# Patient Record
Sex: Female | Born: 1935 | Race: White | Hispanic: No | Smoking: Former smoker
Health system: Southern US, Community
[De-identification: ages and names within clinical notes are randomized; demographics above are authoritative.]

## PROBLEM LIST (undated history)

## (undated) DIAGNOSIS — K449 Diaphragmatic hernia without obstruction or gangrene: Secondary | ICD-10-CM

## (undated) DIAGNOSIS — I219 Acute myocardial infarction, unspecified: Secondary | ICD-10-CM

## (undated) DIAGNOSIS — K5790 Diverticulosis of intestine, part unspecified, without perforation or abscess without bleeding: Secondary | ICD-10-CM

## (undated) DIAGNOSIS — I10 Essential (primary) hypertension: Secondary | ICD-10-CM

## (undated) HISTORY — PX: CORONARY STENT PLACEMENT: SHX1402

## (undated) HISTORY — PX: APPENDECTOMY: SHX54

---

## 2007-04-14 ENCOUNTER — Emergency Department (HOSPITAL_COMMUNITY): Admission: EM | Admit: 2007-04-14 | Discharge: 2007-04-14 | Payer: Self-pay | Admitting: Emergency Medicine

## 2010-12-05 LAB — DIFFERENTIAL
Basophils Absolute: 0
Basophils Relative: 1
Eosinophils Absolute: 0.2
Monocytes Absolute: 1
Monocytes Relative: 13 — ABNORMAL HIGH
Neutrophils Relative %: 65

## 2010-12-05 LAB — CBC
Hemoglobin: 14.1
MCHC: 34.7
MCV: 91.2
RBC: 4.46
RDW: 12.8

## 2010-12-05 LAB — I-STAT 8, (EC8 V) (CONVERTED LAB)
BUN: 13
Bicarbonate: 26.2 — ABNORMAL HIGH
Chloride: 101
Glucose, Bld: 105 — ABNORMAL HIGH
pCO2, Ven: 44.5 — ABNORMAL LOW
pH, Ven: 7.377 — ABNORMAL HIGH

## 2010-12-05 LAB — POCT CARDIAC MARKERS
CKMB, poc: 1.1
Myoglobin, poc: 61.4
Operator id: 198171
Operator id: 198171
Troponin i, poc: <0.05

## 2012-04-14 ENCOUNTER — Institutional Professional Consult (permissible substitution): Payer: Self-pay | Admitting: Pulmonary Disease

## 2012-04-28 ENCOUNTER — Institutional Professional Consult (permissible substitution): Payer: Self-pay | Admitting: Pulmonary Disease

## 2012-05-19 ENCOUNTER — Institutional Professional Consult (permissible substitution): Payer: Self-pay | Admitting: Pulmonary Disease

## 2013-06-09 ENCOUNTER — Emergency Department (HOSPITAL_COMMUNITY)
Admission: EM | Admit: 2013-06-09 | Discharge: 2013-06-09 | Disposition: A | Discharge status: Home / Self Care | Payer: Medicare Other | Attending: Emergency Medicine | Admitting: Emergency Medicine

## 2013-06-09 ENCOUNTER — Emergency Department (HOSPITAL_COMMUNITY): Payer: Medicare Other

## 2013-06-09 ENCOUNTER — Encounter (HOSPITAL_COMMUNITY): Payer: Self-pay | Admitting: Emergency Medicine

## 2013-06-09 DIAGNOSIS — Z9861 Coronary angioplasty status: Secondary | ICD-10-CM | POA: Insufficient documentation

## 2013-06-09 DIAGNOSIS — I252 Old myocardial infarction: Secondary | ICD-10-CM | POA: Insufficient documentation

## 2013-06-09 DIAGNOSIS — Z87891 Personal history of nicotine dependence: Secondary | ICD-10-CM | POA: Insufficient documentation

## 2013-06-09 DIAGNOSIS — Z880 Allergy status to penicillin: Secondary | ICD-10-CM | POA: Insufficient documentation

## 2013-06-09 DIAGNOSIS — Z9089 Acquired absence of other organs: Secondary | ICD-10-CM | POA: Insufficient documentation

## 2013-06-09 DIAGNOSIS — R109 Unspecified abdominal pain: Secondary | ICD-10-CM

## 2013-06-09 DIAGNOSIS — Z79899 Other long term (current) drug therapy: Secondary | ICD-10-CM | POA: Insufficient documentation

## 2013-06-09 DIAGNOSIS — G8929 Other chronic pain: Secondary | ICD-10-CM | POA: Insufficient documentation

## 2013-06-09 DIAGNOSIS — Z8719 Personal history of other diseases of the digestive system: Secondary | ICD-10-CM | POA: Insufficient documentation

## 2013-06-09 DIAGNOSIS — R112 Nausea with vomiting, unspecified: Secondary | ICD-10-CM | POA: Insufficient documentation

## 2013-06-09 DIAGNOSIS — R1084 Generalized abdominal pain: Secondary | ICD-10-CM | POA: Insufficient documentation

## 2013-06-09 DIAGNOSIS — Z7902 Long term (current) use of antithrombotics/antiplatelets: Secondary | ICD-10-CM | POA: Insufficient documentation

## 2013-06-09 DIAGNOSIS — I10 Essential (primary) hypertension: Secondary | ICD-10-CM | POA: Insufficient documentation

## 2013-06-09 HISTORY — DX: Essential (primary) hypertension: I10

## 2013-06-09 HISTORY — DX: Diverticulosis of intestine, part unspecified, without perforation or abscess without bleeding: K57.90

## 2013-06-09 HISTORY — DX: Acute myocardial infarction, unspecified: I21.9

## 2013-06-09 HISTORY — DX: Diaphragmatic hernia without obstruction or gangrene: K44.9

## 2013-06-09 LAB — CBC WITH DIFFERENTIAL/PLATELET
BASOS PCT: 0 % (ref 0–1)
Basophils Absolute: 0 10*3/uL (ref 0.0–0.1)
Eosinophils Absolute: 0.3 10*3/uL (ref 0.0–0.7)
Eosinophils Relative: 4 % (ref 0–5)
HCT: 38.2 % (ref 36.0–46.0)
HEMOGLOBIN: 13.6 g/dL (ref 12.0–15.0)
LYMPHS ABS: 1.1 10*3/uL (ref 0.7–4.0)
Lymphocytes Relative: 14 % (ref 12–46)
MCH: 31 pg (ref 26.0–34.0)
MCHC: 35.6 g/dL (ref 30.0–36.0)
MCV: 87 fL (ref 78.0–100.0)
MONOS PCT: 15 % — AB (ref 3–12)
Monocytes Absolute: 1.2 10*3/uL — ABNORMAL HIGH (ref 0.1–1.0)
NEUTROS ABS: 5.4 10*3/uL (ref 1.7–7.7)
NEUTROS PCT: 66 % (ref 43–77)
PLATELETS: 250 10*3/uL (ref 150–400)
RBC: 4.39 MIL/uL (ref 3.87–5.11)
RDW: 12.4 % (ref 11.5–15.5)
WBC: 8.1 10*3/uL (ref 4.0–10.5)

## 2013-06-09 LAB — COMPREHENSIVE METABOLIC PANEL
ALBUMIN: 3.2 g/dL — AB (ref 3.5–5.2)
ALK PHOS: 77 U/L (ref 39–117)
ALT: 11 U/L (ref 0–35)
AST: 15 U/L (ref 0–37)
BILIRUBIN TOTAL: 0.5 mg/dL (ref 0.3–1.2)
BUN: 10 mg/dL (ref 6–23)
CHLORIDE: 94 meq/L — AB (ref 96–112)
CO2: 24 mEq/L (ref 19–32)
Calcium: 9.2 mg/dL (ref 8.4–10.5)
Creatinine, Ser: 0.75 mg/dL (ref 0.50–1.10)
GFR calc Af Amer: 89 mL/min — ABNORMAL LOW (ref >90)
GFR calc non Af Amer: 77 mL/min — ABNORMAL LOW (ref >90)
GLUCOSE: 142 mg/dL — AB (ref 70–99)
POTASSIUM: 4.3 meq/L (ref 3.7–5.3)
SODIUM: 132 meq/L — AB (ref 137–147)
TOTAL PROTEIN: 7.5 g/dL (ref 6.0–8.3)

## 2013-06-09 LAB — URINALYSIS, ROUTINE W REFLEX MICROSCOPIC
BILIRUBIN URINE: NEGATIVE
Glucose, UA: NEGATIVE mg/dL
HGB URINE DIPSTICK: NEGATIVE
KETONES UR: NEGATIVE mg/dL
Leukocytes, UA: NEGATIVE
NITRITE: NEGATIVE
PH: 6.5 (ref 5.0–8.0)
Protein, ur: NEGATIVE mg/dL
SPECIFIC GRAVITY, URINE: 1.012 (ref 1.005–1.030)
Urobilinogen, UA: 0.2 mg/dL (ref 0.0–1.0)

## 2013-06-09 LAB — I-STAT TROPONIN, ED: TROPONIN I, POC: 0.01 ng/mL (ref 0.00–0.08)

## 2013-06-09 LAB — I-STAT CG4 LACTIC ACID, ED: Lactic Acid, Venous: 1.39 mmol/L (ref 0.5–2.2)

## 2013-06-09 LAB — LIPASE, BLOOD: LIPASE: 24 U/L (ref 11–59)

## 2013-06-09 MED ORDER — MORPHINE SULFATE 4 MG/ML IJ SOLN
4.0000 mg | Freq: Once | INTRAMUSCULAR | Status: AC
  Administered 2013-06-09: 4 mg via INTRAVENOUS
  Filled 2013-06-09: qty 1

## 2013-06-09 MED ORDER — ONDANSETRON HCL 4 MG/2ML IJ SOLN
4.0000 mg | Freq: Once | INTRAMUSCULAR | Status: AC
  Administered 2013-06-09: 4 mg via INTRAVENOUS
  Filled 2013-06-09: qty 2

## 2013-06-09 MED ORDER — DOCUSATE SODIUM 100 MG PO CAPS: 100.0000 mg | ORAL_CAPSULE | Freq: Two times a day (BID) | ORAL | Status: AC

## 2013-06-09 MED ORDER — OXYCODONE-ACETAMINOPHEN 5-325 MG PO TABS: 1.0000 | ORAL_TABLET | Freq: Four times a day (QID) | ORAL | Status: AC | PRN

## 2013-06-09 MED ORDER — GI COCKTAIL ~~LOC~~
30.0000 mL | Freq: Once | ORAL | Status: AC
  Administered 2013-06-09: 30 mL via ORAL
  Filled 2013-06-09: qty 30

## 2013-06-09 MED ORDER — ONDANSETRON 4 MG PO TBDP: 4.0000 mg | ORAL_TABLET | Freq: Three times a day (TID) | ORAL | Status: AC | PRN

## 2013-06-09 NOTE — ED Notes (Signed)
Returned from xray

## 2013-06-09 NOTE — ED Provider Notes (Signed)
CSN: [id number]     Arrival date & time 06/09/13  4098 History   First MD Initiated Contact with Patient 06/09/13 [id number]     Chief Complaint  Patient presents with  . Abdominal Pain     (Consider location/radiation/quality/duration/timing/severity/associated sxs/prior Treatment) HPI Comments: Patient with h/o MI, diverticulosis -- presents with c/o generalized abdominal pain x 1 month. She has been seen in ED and by PCP. She states she has had tests and CT scan which did not show problem. She is scheduled to see a gastroenterologist in 1 week. She has been having N/V and pain with eating. Her appetite is decreased. She is on Nexium and Zofran at current time. H/o appendectomy. No fever, URI sx, CP, SOB, urinary symptoms. No prior colonoscopies. The onset of this condition was acute. The course is constant. Alleviating factors: none.    Patient is a 80 y.o. female presenting with abdominal pain. The history is provided by the patient and a relative.  Abdominal Pain Associated symptoms: nausea and vomiting   Associated symptoms: no chest pain, no cough, no diarrhea, no dysuria, no fever and no sore throat     Past Medical History  Diagnosis Date  . Diverticulosis   . Hiatal hernia   . Hypertension   . Myocardial infarction    Past Surgical History  Procedure Laterality Date  . Appendectomy    . Coronary stent placement     History reviewed. No pertinent family history. History  Substance Use Topics  . Smoking status: Former Games developer  . Smokeless tobacco: Not on file  . Alcohol Use: No   OB History   Grav Para Term Preterm Abortions TAB SAB Ect Mult Living                 Review of Systems  Constitutional: Negative for fever.  HENT: Negative for rhinorrhea and sore throat.   Eyes: Negative for redness.  Respiratory: Negative for cough.   Cardiovascular: Negative for chest pain.  Gastrointestinal: Positive for nausea, vomiting and abdominal pain. Negative for diarrhea.    Genitourinary: Negative for dysuria.  Musculoskeletal: Negative for myalgias.  Skin: Negative for rash.  Neurological: Negative for headaches.      Allergies  Darvon; Penicillins; and Sulfa antibiotics  Home Medications   Current Outpatient Rx  Name  Route  Sig  Dispense  Refill  . ADVAIR DISKUS 250-50 MCG/DOSE AEPB               . azithromycin (ZITHROMAX) 250 MG tablet               . carvedilol (COREG) 25 MG tablet   Oral   Take 25 mg by mouth 2 (two) times daily.         . clopidogrel (PLAVIX) 75 MG tablet   Oral   Take 75 mg by mouth daily.         Marland Kitchen dicyclomine (BENTYL) 20 MG tablet               . erythromycin ophthalmic ointment               . fluconazole (DIFLUCAN) 100 MG tablet               . lisinopril (PRINIVIL,ZESTRIL) 2.5 MG tablet   Oral   Take 2.5 mg by mouth daily.         Marland Kitchen NEXIUM 40 MG capsule   Oral   Take 40 mg by mouth 2 (two) times  daily.         . ondansetron (ZOFRAN-ODT) 4 MG disintegrating tablet                BP 126/89  Pulse 61  Temp(Src) 98.3 F (36.8 C) (Oral)  Resp 16  Ht 4\' 9"  (1.448 m)  Wt 104 lb 11.2 oz (47.492 kg)  BMI 22.65 kg/m2  SpO2 97%  Physical Exam  Nursing note and vitals reviewed. Constitutional: She appears well-developed and well-nourished.  HENT:  Head: Normocephalic and atraumatic.  Eyes: Conjunctivae are normal. Right eye exhibits no discharge. Left eye exhibits no discharge.  Neck: Normal range of motion. Neck supple.  Cardiovascular: Normal rate, regular rhythm and normal heart sounds.   Pulmonary/Chest: Effort normal and breath sounds normal.  Abdominal: Soft. Bowel sounds are normal. She exhibits no distension. There is generalized tenderness (mild). There is no rebound and no guarding.    Neurological: She is alert.  Skin: Skin is warm and dry.  Psychiatric: She has a normal mood and affect.    ED Course  Procedures (including critical care time)  Labs  Review Labs Reviewed  CBC WITH DIFFERENTIAL - Abnormal; Notable for the following:    Monocytes Relative 15 (*)    Monocytes Absolute 1.2 (*)    All other components within normal limits  COMPREHENSIVE METABOLIC PANEL - Abnormal; Notable for the following:    Sodium 132 (*)    Chloride 94 (*)    Glucose, Bld 142 (*)    Albumin 3.2 (*)    GFR calc non Af Amer 77 (*)    GFR calc Af Amer 89 (*)    All other components within normal limits  LIPASE, BLOOD  URINALYSIS, ROUTINE W REFLEX MICROSCOPIC  I-STAT CG4 LACTIC ACID, ED  I-STAT TROPOININ, ED   Imaging Review Dg Abd Acute W/chest  06/09/2013   CLINICAL DATA:  Abdominal pain and weakness  EXAM: ACUTE ABDOMEN SERIES (ABDOMEN 2 VIEW & CHEST 1 VIEW)  COMPARISON:  06/05/2013  FINDINGS: Cardiac shadow is stable. A hiatal hernia is again identified. No focal infiltrate is seen. The abdomen shows a nonobstructive bowel gas pattern. No free air is noted. Fecal material is seen within the colon stable from the prior exam. No acute abnormality is noted.  IMPRESSION: No acute abnormality seen.   Electronically Signed   By: Alcide CleverMark  Lukens M.D.   On: 06/09/2013 10:53     EKG Interpretation None      10:00 AM Patient seen and examined. Work-up initiated. Medications ordered.   Vital signs reviewed and are as follows: Filed Vitals:   06/09/13 0937  BP: 126/89  Pulse: 61  Temp: 98.3 F (36.8 C)  Resp: 16   10:56 AM Patient was discussed with Layla MawKristen N Ward, DO  CT from 05/27/13 reviewed. Shows large hiatal hernia and moderate diverticulosis without diverticulitis.  Patient improved with pain medication. On exam, abdomen remains soft. Discussed results with patient and family. Strongly encouraged GI followup as planned.  Patient discussed with and seen by Dr. Elesa MassedWard.   Patient counseled on use of narcotic pain medications. Counseled not to combine these medications with others containing tylenol. Urged not to drink alcohol, drive, or perform  any other activities that requires focus while taking these medications. The patient verbalizes understanding and agrees with the plan.  The patient was urged to return to the Emergency Department immediately with worsening of current symptoms, worsening abdominal pain, persistent vomiting, blood noted in stools, fever, or any  other concerns. The patient verbalized understanding.   MDM   Final diagnoses:  Abdominal pain   Patient with chronic abdominal pain. Labs performed today are reassuring. Acute abdomen film is unremarkable. Abdomen remains soft. Do not suspect any surgical emergencies today. Pain control given. Patient will take Percocet and Colace at home for symptomatic relief. She otherwise appears well, nontoxic. She has appropriate followup coming. Return instructions discussed with patient.   No dangerous or life-threatening conditions suspected or identified by history, physical exam, and by work-up. No indications for hospitalization identified.      Renne Crigler, PA-C 06/09/13 1507

## 2013-06-09 NOTE — ED Notes (Signed)
Pt c/o severe abd pain with n/v for past month. Denies bowel/bladder changes. She has been for evaluation at ED and pcp in [place] and has multiple labs, urine tests and imagining and "all they found was diverticulosis and a hiatal hernia." she made and appt to f/u with GI doctor but they can not see her until next week and she is still in pain and unable to eat

## 2013-06-09 NOTE — Discharge Instructions (Signed)
Please read and follow all provided instructions.  Your diagnoses today include:  1. Abdominal pain     Tests performed today include:  Blood counts and electrolytes  Blood tests to check liver and kidney function  Blood tests to check pancreas function  Urine test to look for infection and pregnancy (in women)  Vital signs. See below for your results today.   Medications prescribed:   Percocet (oxycodone/acetaminophen) - narcotic pain medication  DO NOT drive or perform any activities that require you to be awake and alert because this medicine can make you drowsy. BE VERY CAREFUL not to take multiple medicines containing Tylenol (also called acetaminophen). Doing so can lead to an overdose which can damage your liver and cause liver failure and possibly death.   Zofran (ondansetron) - for nausea and vomiting   Colace - stool softener to prevent constipation  This medication can be found over-the-counter.   Take any prescribed medications only as directed.  Home care instructions:   Follow any educational materials contained in this packet.  Follow-up instructions: Please follow-up with your primary care provider in the next 3 days for further evaluation of your symptoms. Keep your appointment with the stomach doctor.   Return instructions:  SEEK IMMEDIATE MEDICAL ATTENTION IF:  The pain does not go away or becomes severe   A temperature above 101F develops   Repeated vomiting occurs (multiple episodes)   The pain becomes localized to portions of the abdomen. The right side could possibly be appendicitis. In an adult, the left lower portion of the abdomen could be colitis or diverticulitis.   Blood is being passed in stools or vomit (bright red or black tarry stools)   You develop chest pain, difficulty breathing, dizziness or fainting, or become confused, poorly responsive, or inconsolable (young children)  If you have any other emergent concerns regarding  your health  Additional Information: Abdominal (belly) pain can be caused by many things. Your caregiver performed an examination and possibly ordered blood/urine tests and imaging (CT scan, x-rays, ultrasound). Many cases can be observed and treated at home after initial evaluation in the emergency department. Even though you are being discharged home, abdominal pain can be unpredictable. Therefore, you need a repeated exam if your pain does not resolve, returns, or worsens. Most patients with abdominal pain don't have to be admitted to the hospital or have surgery, but serious problems like appendicitis and gallbladder attacks can start out as nonspecific pain. Many abdominal conditions cannot be diagnosed in one visit, so follow-up evaluations are very important.  Your vital signs today were: BP 126/89   Pulse 61   Temp(Src) 98.3 F (36.8 C) (Oral)   Resp 16   Ht 4\' 9"  (1.448 m)   Wt 104 lb 11.2 oz (47.492 kg)   BMI 22.65 kg/m2   SpO2 97% If your blood pressure (bp) was elevated above 135/85 this visit, please have this repeated by your doctor within one month. --------------

## 2013-06-09 NOTE — ED Notes (Signed)
Lactic acid results given to Geiple, PA-C 

## 2013-06-09 NOTE — ED Notes (Signed)
Patient transported to X-ray 

## 2013-06-09 NOTE — ED Provider Notes (Signed)
Medical screening examination/treatment/procedure(s) were conducted as a shared visit with non-physician practitioner(s) and myself.  I personally evaluated the patient during the encounter.   EKG Interpretation None      Pt is a 80 y.o. female with a history of cardiac disease, diverticulosis who presents emergency department with one month of abdominal pain. She has had intermittent nausea and vomiting. She is status post appendectomy. No fevers, chills, diarrhea, bloody stool or melena, dysuria or hematuria, vaginal bleeding or discharge. Patient has been seen by her primary care physician and [facility] and had negative blood work and CT scan. Obtain outside records which showed a hiatal hernia and diverticulosis without signs of diverticulitis. She has followup with GI. She is here in the emergency department that she feels she cannot control her p.m. and was not discharge with pain medication. Repeat blood work, x-ray and urine are unremarkable. Her abdominal exam is relatively benign. She is hemodynamically stable. I do not feel she needs repeat CT imaging at this time. She states her bowel pain is not different than it has been for the past month. Patient and family comfortable plan for discharge home. Have given strict return precautions.  Layla MawKristen N Arvis Zwahlen, DO 06/09/13 1704

## 2015-02-01 ENCOUNTER — Emergency Department (HOSPITAL_COMMUNITY)
Admission: EM | Admit: 2015-02-01 | Discharge: 2015-02-01 | Disposition: A | Discharge status: Home / Self Care | Payer: Medicare Other | Attending: Emergency Medicine | Admitting: Emergency Medicine

## 2015-02-01 ENCOUNTER — Emergency Department (HOSPITAL_COMMUNITY): Payer: Medicare Other

## 2015-02-01 ENCOUNTER — Encounter (HOSPITAL_COMMUNITY): Payer: Self-pay | Admitting: *Deleted

## 2015-02-01 DIAGNOSIS — Z79899 Other long term (current) drug therapy: Secondary | ICD-10-CM | POA: Diagnosis not present

## 2015-02-01 DIAGNOSIS — Z7951 Long term (current) use of inhaled steroids: Secondary | ICD-10-CM | POA: Insufficient documentation

## 2015-02-01 DIAGNOSIS — Z9861 Coronary angioplasty status: Secondary | ICD-10-CM | POA: Insufficient documentation

## 2015-02-01 DIAGNOSIS — Z880 Allergy status to penicillin: Secondary | ICD-10-CM | POA: Diagnosis not present

## 2015-02-01 DIAGNOSIS — R630 Anorexia: Secondary | ICD-10-CM | POA: Insufficient documentation

## 2015-02-01 DIAGNOSIS — Z7901 Long term (current) use of anticoagulants: Secondary | ICD-10-CM | POA: Insufficient documentation

## 2015-02-01 DIAGNOSIS — T148 Other injury of unspecified body region: Secondary | ICD-10-CM | POA: Insufficient documentation

## 2015-02-01 DIAGNOSIS — Z87891 Personal history of nicotine dependence: Secondary | ICD-10-CM | POA: Insufficient documentation

## 2015-02-01 DIAGNOSIS — R531 Weakness: Secondary | ICD-10-CM | POA: Insufficient documentation

## 2015-02-01 DIAGNOSIS — R5383 Other fatigue: Secondary | ICD-10-CM | POA: Diagnosis not present

## 2015-02-01 DIAGNOSIS — I252 Old myocardial infarction: Secondary | ICD-10-CM | POA: Insufficient documentation

## 2015-02-01 DIAGNOSIS — I10 Essential (primary) hypertension: Secondary | ICD-10-CM | POA: Insufficient documentation

## 2015-02-01 DIAGNOSIS — I2510 Atherosclerotic heart disease of native coronary artery without angina pectoris: Secondary | ICD-10-CM | POA: Insufficient documentation

## 2015-02-01 DIAGNOSIS — Z87442 Personal history of urinary calculi: Secondary | ICD-10-CM | POA: Diagnosis not present

## 2015-02-01 LAB — URINALYSIS, ROUTINE W REFLEX MICROSCOPIC
Bilirubin Urine: NEGATIVE
Glucose, UA: NEGATIVE mg/dL
Hgb urine dipstick: NEGATIVE
Ketones, ur: NEGATIVE mg/dL
Leukocytes, UA: NEGATIVE
Nitrite: NEGATIVE
Protein, ur: NEGATIVE mg/dL
Specific Gravity, Urine: 1.014 (ref 1.005–1.030)
pH: 7.5 (ref 5.0–8.0)

## 2015-02-01 LAB — CBC WITH DIFFERENTIAL/PLATELET
Basophils Absolute: 0 10*3/uL (ref 0.0–0.1)
Basophils Relative: 0 %
Eosinophils Absolute: 0.2 10*3/uL (ref 0.0–0.7)
Eosinophils Relative: 2 %
HCT: 34.4 % — ABNORMAL LOW (ref 36.0–46.0)
Hemoglobin: 11.9 g/dL — ABNORMAL LOW (ref 12.0–15.0)
Lymphocytes Relative: 14 %
Lymphs Abs: 1.2 10*3/uL (ref 0.7–4.0)
MCH: 30.4 pg (ref 26.0–34.0)
MCHC: 34.6 g/dL (ref 30.0–36.0)
MCV: 88 fL (ref 78.0–100.0)
Monocytes Absolute: 1.2 10*3/uL — ABNORMAL HIGH (ref 0.1–1.0)
Monocytes Relative: 13 %
Neutro Abs: 6.1 10*3/uL (ref 1.7–7.7)
Neutrophils Relative %: 71 %
Platelets: 174 10*3/uL (ref 150–400)
RBC: 3.91 MIL/uL (ref 3.87–5.11)
RDW: 13.5 % (ref 11.5–15.5)
WBC: 8.6 10*3/uL (ref 4.0–10.5)

## 2015-02-01 LAB — I-STAT TROPONIN, ED: Troponin i, poc: 3.71 ng/mL (ref 0.00–0.08)

## 2015-02-01 LAB — COMPREHENSIVE METABOLIC PANEL
ALT: 18 U/L (ref 14–54)
AST: 22 U/L (ref 15–41)
Albumin: 2.9 g/dL — ABNORMAL LOW (ref 3.5–5.0)
Alkaline Phosphatase: 55 U/L (ref 38–126)
Anion gap: 8 (ref 5–15)
BUN: 15 mg/dL (ref 6–20)
CO2: 22 mmol/L (ref 22–32)
Calcium: 8.5 mg/dL — ABNORMAL LOW (ref 8.9–10.3)
Chloride: 101 mmol/L (ref 101–111)
Creatinine, Ser: 0.84 mg/dL (ref 0.44–1.00)
GFR calc Af Amer: >60 mL/min (ref >60)
GFR calc non Af Amer: >60 mL/min (ref >60)
Glucose, Bld: 120 mg/dL — ABNORMAL HIGH (ref 65–99)
Potassium: 4 mmol/L (ref 3.5–5.1)
Sodium: 131 mmol/L — ABNORMAL LOW (ref 135–145)
Total Bilirubin: 0.8 mg/dL (ref 0.3–1.2)
Total Protein: 5.8 g/dL — ABNORMAL LOW (ref 6.5–8.1)

## 2015-02-01 MED ORDER — SODIUM CHLORIDE 0.9 % IV BOLUS (SEPSIS)
1000.0000 mL | Freq: Once | INTRAVENOUS | Status: AC
Start: 2015-02-01 — End: 2015-02-01
  Administered 2015-02-01: 1000 mL via INTRAVENOUS

## 2015-02-01 NOTE — ED Notes (Signed)
Pt off unit with XrAY

## 2015-02-01 NOTE — ED Notes (Signed)
Contacted [facility] to fax pt's most recent EKG, per Newell RubbermaidJeffrey Hedges.

## 2015-02-01 NOTE — Discharge Instructions (Signed)
Weakness Weakness is a lack of strength. It may be felt all over the body (generalized) or in one specific part of the body (focal). Some causes of weakness can be serious. You may need further medical evaluation, especially if you are elderly or you have a history of immunosuppression (such as chemotherapy or HIV), kidney disease, heart disease, or diabetes. CAUSES  Weakness can be caused by many different things, including:  Infection.  Physical exhaustion.  Internal bleeding or other blood loss that results in a lack of red blood cells (anemia).  Dehydration. This cause is more common in elderly people.  Side effects or electrolyte abnormalities from medicines, such as pain medicines or sedatives.  Emotional distress, anxiety, or depression.  Circulation problems, especially severe peripheral arterial disease.  Heart disease, such as rapid atrial fibrillation, bradycardia, or heart failure.  Nervous system disorders, such as Guillain-Barr syndrome, multiple sclerosis, or stroke. DIAGNOSIS  To find the cause of your weakness, your caregiver will take your history and perform a physical exam. Lab tests or X-rays may also be ordered, if needed. TREATMENT  Treatment of weakness depends on the cause of your symptoms and can vary greatly. HOME CARE INSTRUCTIONS   Rest as needed.  Eat a well-balanced diet.  Try to get some exercise every day.  Only take over-the-counter or prescription medicines as directed by your caregiver. SEEK MEDICAL CARE IF:   Your weakness seems to be getting worse or spreads to other parts of your body.  You develop new aches or pains. SEEK IMMEDIATE MEDICAL CARE IF:   You cannot perform your normal daily activities, such as getting dressed and feeding yourself.  You cannot walk up and down stairs, or you feel exhausted when you do so.  You have shortness of breath or chest pain.  You have difficulty moving parts of your body.  You have weakness  in only one area of the body or on only one side of the body.  You have a fever.  You have trouble speaking or swallowing.  You cannot control your bladder or bowel movements.  You have black or bloody vomit or stools. MAKE SURE YOU:  Understand these instructions.  Will watch your condition.  Will get help right away if you are not doing well or get worse.   This information is not intended to replace advice given to you by your health care provider. Make sure you discuss any questions you have with your health care provider.   Document Released: 03/03/2005 Document Revised: 09/02/2011 Document Reviewed: 05/02/2011 Elsevier Interactive Patient Education [employer]2016 Elsevier Inc.   Please follow-up with your primary care provider for reevaluation. Please attempt maintaining adequate by mouth intake, please monitor for new or worsening signs or symptoms, if any present please return to the ED for further evaluation and management.

## 2015-02-01 NOTE — ED Provider Notes (Signed)
CSN: [id number]     Arrival date & time 02/01/15  1128 History   First MD Initiated Contact with Patient 02/01/15 1132     Chief Complaint  Patient presents with  . Weakness   HPI   81 year old female presents today with complaints of weakness. She has significant past medical history including coronary artery disease with stents. [facility] and [facility] for cardiac cath for an STEMI. Patient reports that after the catheterization she no longer had chest pain, was discharged home and started on atorvastatin in addition to her chronic daily medications. She reports that she has been able to ambulate, with eating drinking and passing normal stool since discharge, she reports last normal bowel movement was yesterday. She reports that she got up this morning was able to ambulate, but felt "fatigued". She reports that she did not have an appetite. Patient denies any chest pain, shortness of breath, fever, chills, nausea or vomiting, abdominal pain almond changes in the color clarity or characteristics of her urine or bowel movements.    Past Medical History  Diagnosis Date  . Diverticulosis   . Hiatal hernia   . Hypertension   . Myocardial infarction [facility](HCC)    Past Surgical History  Procedure Laterality Date  . Appendectomy    . Coronary stent placement     No family history on file. Social History  Substance Use Topics  . Smoking status: Former Games developermoker  . Smokeless tobacco: None  . Alcohol Use: No   OB History    No data available     Review of Systems  All other systems reviewed and are negative.  Allergies  Darvon; Penicillins; and Sulfa antibiotics  Home Medications   Prior to Admission medications   Medication Sig Start Date End Date Taking? Authorizing Provider  acetaminophen (TYLENOL) 500 MG tablet Take 1,000 mg by mouth every 6 (six) hours as needed for mild pain.   Yes Historical Provider,  MD  ADVAIR DISKUS 250-50 MCG/DOSE AEPB Inhale 1 puff into the lungs 2 (two) times daily.  05/06/13  Yes Historical Provider, MD  albuterol (PROVENTIL) (2.5 MG/3ML) 0.083% nebulizer solution Take 2.5 mg by nebulization every 6 (six) hours as needed for wheezing or shortness of breath.   Yes Historical Provider, MD  atorvastatin (LIPITOR) 40 MG tablet Take 40 mg by mouth daily. 01/30/15  Yes Historical Provider, MD  carvedilol (COREG) 25 MG tablet Take 25 mg by mouth 2 (two) times daily. 04/11/13  Yes Historical Provider, MD  clopidogrel (PLAVIX) 75 MG tablet Take 75 mg by mouth daily. 05/29/13  Yes Historical Provider, MD  dicyclomine (BENTYL) 20 MG tablet Take 20 mg by mouth every 6 (six) hours as needed for spasms.  06/05/13  Yes Historical Provider, MD  docusate sodium (COLACE) 100 MG capsule Take 1 capsule (100 mg total) by mouth every 12 (twelve) hours. 06/09/13  Yes Renne CriglerJoshua Geiple, PA-C  fluconazole (DIFLUCAN) 100 MG tablet Take 100 mg by mouth daily as needed (for thrush).  06/06/13  Yes Historical Provider, MD  lisinopril (PRINIVIL,ZESTRIL) 2.5 MG tablet Take 2.5 mg by mouth daily. 05/22/13  Yes Historical Provider, MD  NEXIUM 40 MG capsule Take 40 mg by mouth 2 (two) times daily. 05/26/13  Yes Historical Provider, MD  nitroGLYCERIN (NITROSTAT) 0.4 MG SL tablet Place 0.4 mg under the tongue every 5 (five) minutes as needed for chest pain.   Yes Historical Provider, MD  promethazine (PHENERGAN) 25 MG tablet Take 25 mg by mouth every 6 (six) hours as needed for nausea or vomiting.   Yes Historical Provider, MD  ondansetron (ZOFRAN ODT) 4 MG disintegrating tablet Take 1 tablet (4 mg total) by mouth every 8 (eight) hours as needed for nausea or vomiting. Patient not taking: Reported on 02/01/2015 06/09/13   Renne Crigler, PA-C  oxyCODONE-acetaminophen (PERCOCET/ROXICET) 5-325 MG per tablet Take 1-2 tablets by mouth every 6 (six) hours as needed for severe pain. Patient not taking: Reported on 02/01/2015  06/09/13   Renne Crigler, PA-C   BP 151/69 mmHg  Pulse 66  Temp(Src) 98.9 F (37.2 C) (Oral)  Resp 19  Ht  (1.448 m)  Wt 104 lb (47.174 kg)  BMI 22.50 kg/m2  SpO2 95%   Physical Exam  Constitutional: She is oriented to person, place, and time. She appears well-developed. No distress.  HENT:  Head: Normocephalic and atraumatic.  Eyes: Conjunctivae are normal. Pupils are equal, round, and reactive to light. Right eye exhibits no discharge. Left eye exhibits no discharge. No scleral icterus.  Neck: Normal range of motion. No JVD present. No tracheal deviation present.  Cardiovascular: Normal rate, regular rhythm and intact distal pulses.  Exam reveals no gallop and no friction rub.   Pulmonary/Chest: Effort normal and breath sounds normal. No stridor. No respiratory distress. She has no wheezes. She has no rales. She exhibits no tenderness.  Abdominal: Soft. She exhibits no distension and no mass. There is no tenderness. There is no rebound and no guarding.  Musculoskeletal: Normal range of motion. She exhibits no edema.  No significant lower extremity swelling or edema  Neurological: She is alert and oriented to person, place, and time. Coordination normal.  Skin: Skin is warm and dry.  Bruising noted to the upper extremity is bilateral  Psychiatric: She has a normal mood and affect. Her behavior is normal. Judgment and thought content normal.  Nursing note and vitals reviewed.   ED Course  Procedures (including critical care time) Labs Review Labs Reviewed  CBC WITH DIFFERENTIAL/PLATELET - Abnormal; Notable for the following:    Hemoglobin 11.9 (*)    HCT 34.4 (*)    Monocytes Absolute 1.2 (*)    All other components within normal limits  COMPREHENSIVE METABOLIC PANEL - Abnormal; Notable for the following:    Sodium 131 (*)    Glucose, Bld 120 (*)    Calcium 8.5 (*)    Total Protein 5.8 (*)    Albumin 2.9 (*)    All other components within normal limits  I-STAT  TROPOININ, ED - Abnormal; Notable for the following:    Troponin i, poc 3.71 (*)    All other components within normal limits  URINALYSIS, ROUTINE W REFLEX MICROSCOPIC (NOT AT [facility])    Imaging Review Dg Chest 2 View  02/01/2015  CLINICAL DATA:  Shortness of breath, RIGHT low back pain from fall at [facility], recent MI and coronary angioplasty with stenting EXAM: CHEST  2 VIEW COMPARISON:  01/27/2015 FINDINGS: Enlargement of cardiac silhouette with note of a coronary stent. Atherosclerotic calcification aorta. Large hiatal hernia. Mediastinal contours and pulmonary vascularity otherwise normal. Bibasilar atelectasis and underlying emphysematous changes. No acute infiltrate, pleural effusion or pneumothorax. Bones demineralized. IMPRESSION: Enlargement of cardiac silhouette post coronary artery stenting. COPD changes with bibasilar atelectasis. Large hiatal hernia. Electronically Signed   By: Ulyses Southward M.D.   On: 02/01/2015 12:36   I have personally reviewed and evaluated these images  and lab results as part of my medical decision-making.   EKG Interpretation   Date/Time:  Thursday February 01 2015 11:33:38 EST Ventricular Rate:  72 PR Interval:  170 QRS Duration: 105 QT Interval:  399 QTC Calculation: 437 R Axis:   -18 Text Interpretation:  Sinus rhythm Multiple ventricular premature  complexes Left bundle branch block minimal concordant ST depression v2?  inferior infarct, age indeterminant Confirmed by KOHUT  MD, STEPHEN ([id number])  on 02/01/2015 11:43:31 AM      MDM   Final diagnoses:  Weakness    Labs: I-STAT troponin, CBC, CMP, urinalysis- troponin 3.71 (previously 13.3 and 01/30/2015)   Imaging: dg chest- COPD changes with bibasilar atelectasis, no acute infiltrate, pleural effusion or pneumothorax  Consults:   Therapeutics: Normal saline  Discharge Meds:   Assessment/Plan: 81 year old female presents today with fatigue. Patient recently hospitalized with an  STEMI, she reports no chest pain, shortness of breath, abdominal pain, lotion swelling or edema. No significant findings on physical or laboratory evaluation. Patient has not had any food today, this is likely contribute to her fatigue. Patient was given normal saline here in the ED, with some juice and food, was ambulated without difficulty. Patient's recent troponin of 13.3 on 11:15, now down to 3.71 with no acute changes on EKG. No significant concern for ACS. No signs of infection. Patient will be discharged home with her daughter who lives with her with instructions to follow-up with her primary care provider, cardiologist as instructed. She's given strict return precautions, verbalized understanding and agreement today's plan and had no further questions or concerns at time of discharge.          Eyvonne Mechanic, PA-C 02/01/15 1532  Raeford Razor, MD 02/02/15 [phone]

## 2015-02-01 NOTE — ED Notes (Signed)
Pt able to ambulate with walker and stand-by assist.  PA observed.  Pt eating before discharge per PA.

## 2015-02-01 NOTE — ED Notes (Signed)
Pt here via [facility] for generalized weakness, bil shoulder and back pain since being released from hospital on Tues.  Discharged from [facility] on Tues after being tx for myocardial infarction.  Pt denies urinary symptoms.  EKG per EMS showed LBBB (pt has hx of).

## 2015-10-25 DIAGNOSIS — L578 Other skin changes due to chronic exposure to nonionizing radiation: Secondary | ICD-10-CM | POA: Diagnosis not present

## 2015-10-25 DIAGNOSIS — L570 Actinic keratosis: Secondary | ICD-10-CM | POA: Diagnosis not present

## 2015-10-25 DIAGNOSIS — L821 Other seborrheic keratosis: Secondary | ICD-10-CM | POA: Diagnosis not present

## 2015-12-04 DIAGNOSIS — R110 Nausea: Secondary | ICD-10-CM | POA: Diagnosis not present

## 2015-12-04 DIAGNOSIS — R5383 Other fatigue: Secondary | ICD-10-CM | POA: Diagnosis not present

## 2015-12-25 DIAGNOSIS — J208 Acute bronchitis due to other specified organisms: Secondary | ICD-10-CM | POA: Diagnosis not present

## 2016-10-01 DIAGNOSIS — E782 Mixed hyperlipidemia: Secondary | ICD-10-CM | POA: Diagnosis not present

## 2016-10-01 DIAGNOSIS — I5042 Chronic combined systolic (congestive) and diastolic (congestive) heart failure: Secondary | ICD-10-CM | POA: Diagnosis not present

## 2016-10-01 DIAGNOSIS — J4520 Mild intermittent asthma, uncomplicated: Secondary | ICD-10-CM | POA: Diagnosis not present

## 2016-10-01 DIAGNOSIS — I10 Essential (primary) hypertension: Secondary | ICD-10-CM | POA: Diagnosis not present

## 2016-10-01 DIAGNOSIS — R5383 Other fatigue: Secondary | ICD-10-CM | POA: Diagnosis not present

## 2016-12-03 DIAGNOSIS — B351 Tinea unguium: Secondary | ICD-10-CM | POA: Diagnosis not present

## 2016-12-10 DIAGNOSIS — L570 Actinic keratosis: Secondary | ICD-10-CM | POA: Diagnosis not present

## 2016-12-11 DIAGNOSIS — I2510 Atherosclerotic heart disease of native coronary artery without angina pectoris: Secondary | ICD-10-CM | POA: Diagnosis not present

## 2016-12-11 DIAGNOSIS — R001 Bradycardia, unspecified: Secondary | ICD-10-CM | POA: Diagnosis not present

## 2016-12-11 DIAGNOSIS — I493 Ventricular premature depolarization: Secondary | ICD-10-CM | POA: Diagnosis not present

## 2016-12-11 DIAGNOSIS — I252 Old myocardial infarction: Secondary | ICD-10-CM | POA: Diagnosis not present

## 2016-12-15 DIAGNOSIS — I447 Left bundle-branch block, unspecified: Secondary | ICD-10-CM | POA: Diagnosis not present

## 2016-12-15 DIAGNOSIS — I493 Ventricular premature depolarization: Secondary | ICD-10-CM | POA: Diagnosis not present

## 2017-02-17 IMAGING — CR DG CHEST 2V
2 series · 2 of 2 positions shown · non-contrast
Comparison: 01/27/2015

CLINICAL DATA: Shortness of breath, RIGHT low back pain from fall
at [facility], recent MI and coronary angioplasty with
stenting

EXAM:
CHEST  2 VIEW

[chest lat]
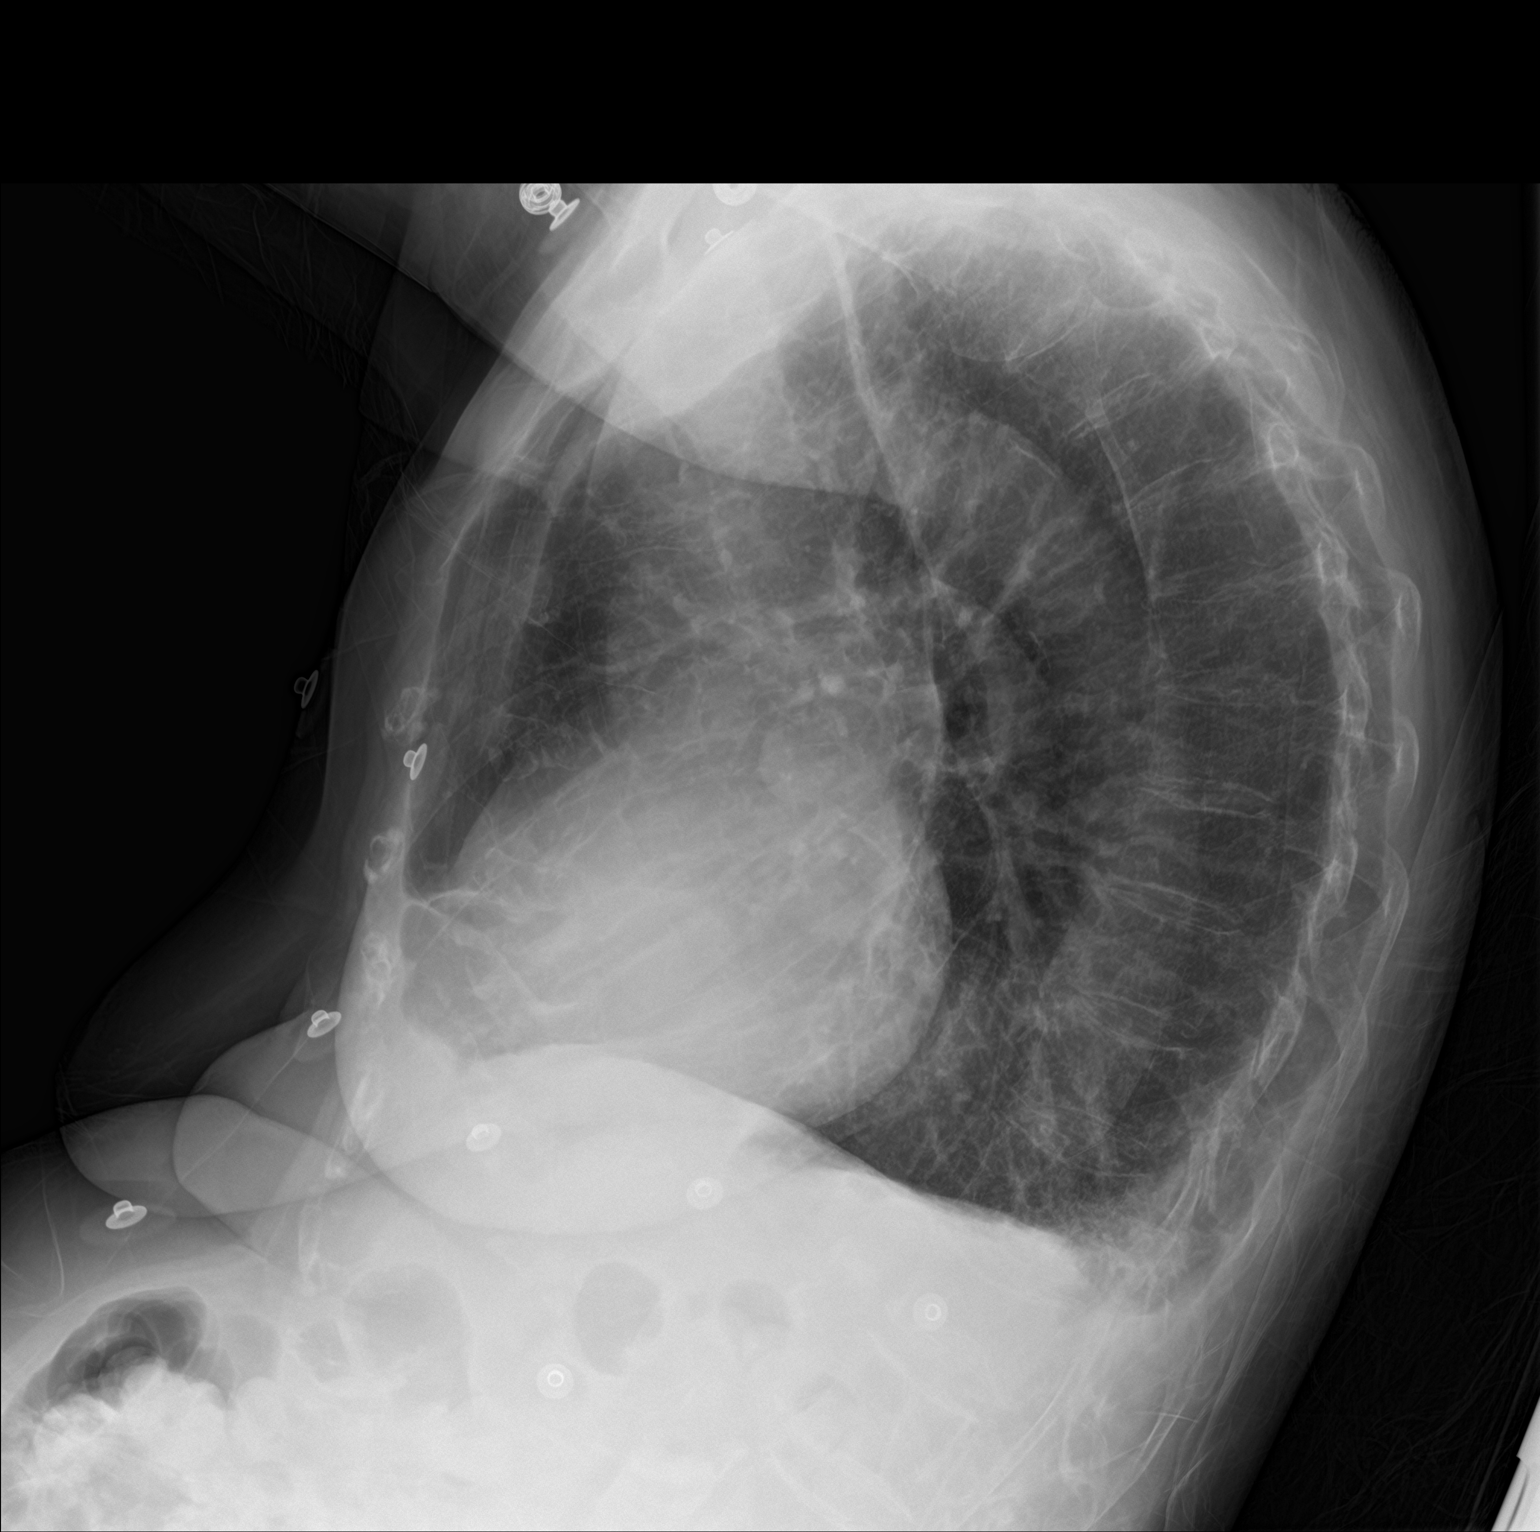

[chest ap]
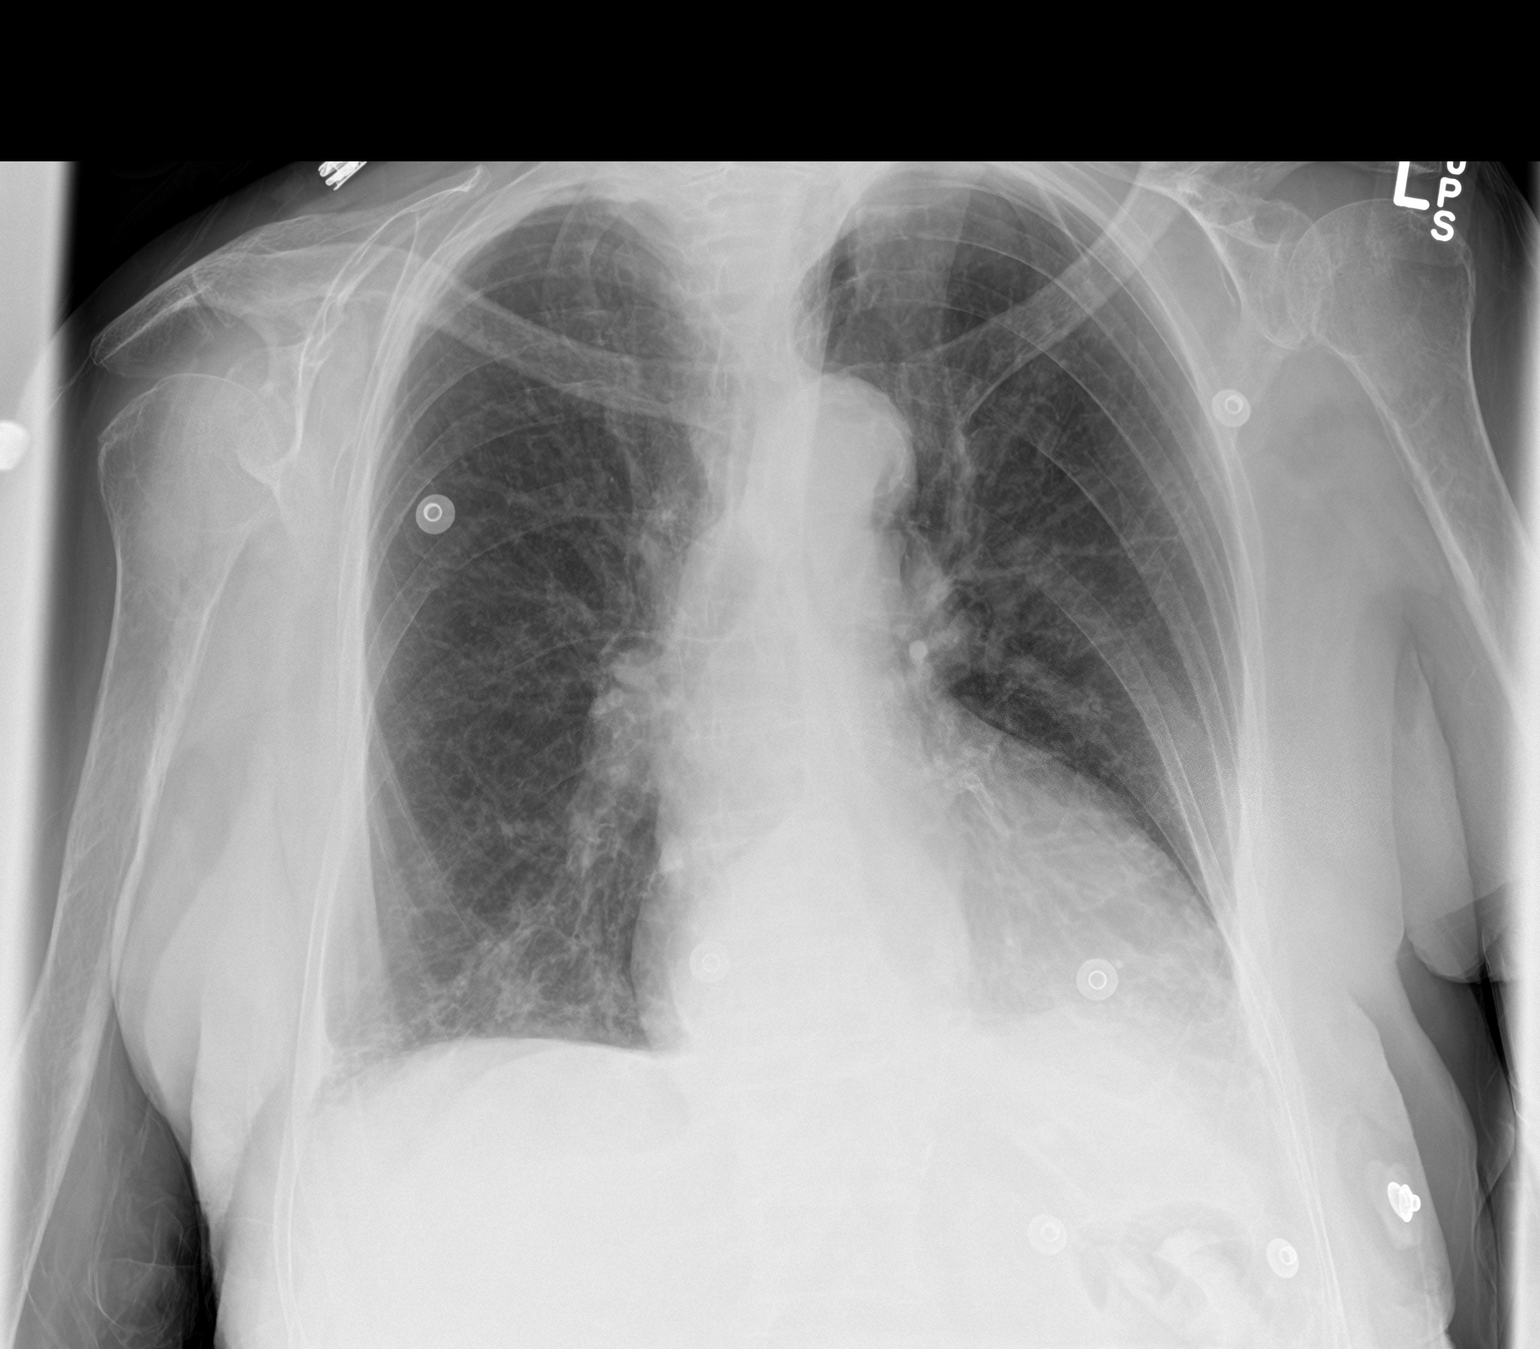

[2 of 2 positions shown; findings below may reference images not displayed]

FINDINGS: Enlargement of cardiac silhouette with note of a coronary stent.

Atherosclerotic calcification aorta.

Large hiatal hernia.

Mediastinal contours and pulmonary vascularity otherwise normal.

Bibasilar atelectasis and underlying emphysematous changes.

No acute infiltrate, pleural effusion or pneumothorax.

Bones demineralized.
IMPRESSION: Enlargement of cardiac silhouette post coronary artery stenting.

COPD changes with bibasilar atelectasis.

Large hiatal hernia.

## 2017-02-19 DIAGNOSIS — E782 Mixed hyperlipidemia: Secondary | ICD-10-CM | POA: Diagnosis not present

## 2017-02-19 DIAGNOSIS — R799 Abnormal finding of blood chemistry, unspecified: Secondary | ICD-10-CM | POA: Diagnosis not present

## 2017-02-19 DIAGNOSIS — Z0000 Encounter for general adult medical examination without abnormal findings: Secondary | ICD-10-CM | POA: Diagnosis not present

## 2017-02-19 DIAGNOSIS — I10 Essential (primary) hypertension: Secondary | ICD-10-CM | POA: Diagnosis not present

## 2017-02-19 DIAGNOSIS — Z23 Encounter for immunization: Secondary | ICD-10-CM | POA: Diagnosis not present

## 2017-02-19 DIAGNOSIS — Z6820 Body mass index (BMI) 20.0-20.9, adult: Secondary | ICD-10-CM | POA: Diagnosis not present

## 2017-02-19 DIAGNOSIS — R5383 Other fatigue: Secondary | ICD-10-CM | POA: Diagnosis not present

## 2017-02-20 DIAGNOSIS — S22060A Wedge compression fracture of T7-T8 vertebra, initial encounter for closed fracture: Secondary | ICD-10-CM | POA: Diagnosis not present

## 2017-02-20 DIAGNOSIS — M546 Pain in thoracic spine: Secondary | ICD-10-CM | POA: Diagnosis not present

## 2017-02-20 DIAGNOSIS — T148XXA Other injury of unspecified body region, initial encounter: Secondary | ICD-10-CM | POA: Diagnosis not present

## 2017-02-20 DIAGNOSIS — S22069A Unspecified fracture of T7-T8 vertebra, initial encounter for closed fracture: Secondary | ICD-10-CM | POA: Diagnosis not present

## 2017-02-20 DIAGNOSIS — R51 Headache: Secondary | ICD-10-CM | POA: Diagnosis not present

## 2017-02-20 DIAGNOSIS — R079 Chest pain, unspecified: Secondary | ICD-10-CM | POA: Diagnosis not present

## 2017-02-20 DIAGNOSIS — M545 Low back pain: Secondary | ICD-10-CM | POA: Diagnosis not present

## 2017-03-05 DIAGNOSIS — S22060S Wedge compression fracture of T7-T8 vertebra, sequela: Secondary | ICD-10-CM | POA: Diagnosis not present

## 2017-09-28 DIAGNOSIS — R5383 Other fatigue: Secondary | ICD-10-CM | POA: Diagnosis not present

## 2017-09-28 DIAGNOSIS — J4520 Mild intermittent asthma, uncomplicated: Secondary | ICD-10-CM | POA: Diagnosis not present

## 2017-09-28 DIAGNOSIS — I10 Essential (primary) hypertension: Secondary | ICD-10-CM | POA: Diagnosis not present

## 2017-09-28 DIAGNOSIS — I5042 Chronic combined systolic (congestive) and diastolic (congestive) heart failure: Secondary | ICD-10-CM | POA: Diagnosis not present

## 2017-10-05 DIAGNOSIS — E119 Type 2 diabetes mellitus without complications: Secondary | ICD-10-CM | POA: Diagnosis not present

## 2017-10-05 DIAGNOSIS — B351 Tinea unguium: Secondary | ICD-10-CM | POA: Diagnosis not present

## 2017-10-29 DIAGNOSIS — R5383 Other fatigue: Secondary | ICD-10-CM | POA: Diagnosis not present

## 2018-02-25 DIAGNOSIS — Z23 Encounter for immunization: Secondary | ICD-10-CM | POA: Diagnosis not present

## 2018-02-25 DIAGNOSIS — F411 Generalized anxiety disorder: Secondary | ICD-10-CM | POA: Diagnosis not present

## 2018-02-25 DIAGNOSIS — I10 Essential (primary) hypertension: Secondary | ICD-10-CM | POA: Diagnosis not present

## 2018-02-25 DIAGNOSIS — R5383 Other fatigue: Secondary | ICD-10-CM | POA: Diagnosis not present

## 2018-06-29 DIAGNOSIS — J4520 Mild intermittent asthma, uncomplicated: Secondary | ICD-10-CM | POA: Diagnosis not present

## 2018-06-29 DIAGNOSIS — I10 Essential (primary) hypertension: Secondary | ICD-10-CM | POA: Diagnosis not present

## 2018-07-21 DIAGNOSIS — Z0000 Encounter for general adult medical examination without abnormal findings: Secondary | ICD-10-CM | POA: Diagnosis not present

## 2018-07-21 DIAGNOSIS — Z6821 Body mass index (BMI) 21.0-21.9, adult: Secondary | ICD-10-CM | POA: Diagnosis not present

## 2018-10-06 DIAGNOSIS — B351 Tinea unguium: Secondary | ICD-10-CM | POA: Diagnosis not present

## 2019-01-06 DIAGNOSIS — R5383 Other fatigue: Secondary | ICD-10-CM | POA: Diagnosis not present

## 2019-01-06 DIAGNOSIS — I10 Essential (primary) hypertension: Secondary | ICD-10-CM | POA: Diagnosis not present

## 2019-01-06 DIAGNOSIS — J4520 Mild intermittent asthma, uncomplicated: Secondary | ICD-10-CM | POA: Diagnosis not present

## 2019-01-06 DIAGNOSIS — Z23 Encounter for immunization: Secondary | ICD-10-CM | POA: Diagnosis not present

## 2019-01-06 DIAGNOSIS — K219 Gastro-esophageal reflux disease without esophagitis: Secondary | ICD-10-CM | POA: Diagnosis not present

## 2019-05-20 ENCOUNTER — Other Ambulatory Visit: Payer: Self-pay | Admitting: Physician Assistant

## 2019-06-14 ENCOUNTER — Telehealth (INDEPENDENT_AMBULATORY_CARE_PROVIDER_SITE_OTHER): Payer: Medicare Other | Admitting: Physician Assistant

## 2019-06-14 VITALS — BP 156/65 | HR 67 | Temp 98.0°F

## 2019-06-14 DIAGNOSIS — J060 Acute laryngopharyngitis: Secondary | ICD-10-CM | POA: Diagnosis not present

## 2019-06-14 MED ORDER — BENZONATATE 100 MG PO CAPS: 100.0000 mg | ORAL_CAPSULE | Freq: Three times a day (TID) | ORAL | 2 refills | Status: AC

## 2019-06-14 MED ORDER — AZITHROMYCIN 250 MG PO TABS: ORAL_TABLET | ORAL | 0 refills | Status: AC

## 2019-06-14 NOTE — Progress Notes (Signed)
Virtual Visit via Telephone Note   This visit type was conducted due to national recommendations for restrictions regarding the COVID-19 Pandemic (e.g. social distancing) in an effort to limit this patient's exposure and mitigate transmission in our community.  Due to her co-morbid illnesses, this patient is at least at moderate risk for complications without adequate follow up.  This format is felt to be most appropriate for this patient at this time.  The patient did not have access to video technology/had technical difficulties with video requiring transitioning to audio format only (telephone).  All issues noted in this document were discussed and addressed.  No physical exam could be performed with this format.  Patient verbally consented to a telehealth visit.   Date:  06/14/2019   ID:  Andrea Reeves, DOB 10/08/1928, MRN [id number]  Patient Location: Home Provider Location: Office  PCP:  Marge Duncans, PA-C     Chief Complaint:  Andrea Reeves  History of Present Illness:    Andrea Reeves is a 86 y.o. female with uri - pt has had sinus congestion, nasal drainage and sneezing for the past few weeks - in the past week she now has a productive cough and more congestion Denies fever, headache, malaise - no wheezing  The patient does not have symptoms concerning for COVID-19 infection (fever, chills, cough, or new shortness of breath).    Past Medical History:  Diagnosis Date  . Diverticulosis   . Hiatal hernia   . Hypertension   . Myocardial infarction    Past Surgical History:  Procedure Laterality Date  . APPENDECTOMY    . CORONARY STENT PLACEMENT       No outpatient medications have been marked as taking for the 06/14/19 encounter (Video Visit) with Marge Duncans, PA-C.     Allergies:   Darvon [propoxyphene], Penicillins, and Sulfa antibiotics   Social History   Tobacco Use  . Smoking status: Former Smoker  Substance Use Topics  . Alcohol use: No  . Drug use: No      Family Hx: The patient's family history is not on file.  ROS:   Please see the history of present illness.    All other systems reviewed and are negative.  Labs/Other Tests and Data Reviewed:    Recent Labs: No results found for requested labs within last 8760 hours.   Recent Lipid Panel No results found for: CHOL, TRIG, HDL, CHOLHDL, LDLCALC, LDLDIRECT  Wt Readings from Last 3 Encounters:  02/01/15 104 lb (47.2 kg)  06/09/13 104 lb 11.2 oz (47.5 kg)     Objective:    Vital Signs:  BP (!) 156/65   Pulse 67   Temp 98 F (36.7 C)    VITAL SIGNS:  reviewed  ASSESSMENT & PLAN:    1. Andrea Reeves - will treat with zpack to take as directed and tessalon perles - follow up if any symptoms change or worsen  COVID-19 Education: The signs and symptoms of COVID-19 were discussed with the patient and how to seek care for testing (follow up with PCP or arrange E-visit). The importance of social distancing was discussed today.  Time:   Today, I have spent 10 minutes with the patient with telehealth technology discussing the above problems.     Medication Adjustments/Labs and Tests Ordered: Current medicines are reviewed at length with the patient today.  Concerns regarding medicines are outlined above.   Tests Ordered: No orders of the defined types were placed in this encounter.   Medication Changes:  No orders of the defined types were placed in this encounter.   Follow Up:  In Person prn  Signed, Jettie Pagan  06/14/2019 2:39 PM    [facility]

## 2019-06-14 NOTE — Assessment & Plan Note (Signed)
[facility] decongestants rx for zpack and tessalon Follow up if any symptoms change or worsen

## 2019-06-28 ENCOUNTER — Other Ambulatory Visit: Payer: Self-pay | Admitting: Physician Assistant

## 2019-07-19 ENCOUNTER — Other Ambulatory Visit: Payer: Self-pay | Admitting: Physician Assistant

## 2019-09-26 ENCOUNTER — Other Ambulatory Visit: Payer: Self-pay | Admitting: Physician Assistant

## 2019-09-29 ENCOUNTER — Other Ambulatory Visit: Payer: Self-pay | Admitting: Physician Assistant

## 2019-10-05 DIAGNOSIS — M79674 Pain in right toe(s): Secondary | ICD-10-CM | POA: Diagnosis not present

## 2019-10-05 DIAGNOSIS — B351 Tinea unguium: Secondary | ICD-10-CM | POA: Diagnosis not present

## 2019-10-05 DIAGNOSIS — M79675 Pain in left toe(s): Secondary | ICD-10-CM | POA: Diagnosis not present

## 2020-01-16 ENCOUNTER — Ambulatory Visit: Payer: Medicare Other

## 2020-02-06 DIAGNOSIS — I340 Nonrheumatic mitral (valve) insufficiency: Secondary | ICD-10-CM | POA: Diagnosis not present

## 2020-02-06 DIAGNOSIS — I249 Acute ischemic heart disease, unspecified: Secondary | ICD-10-CM

## 2020-02-06 DIAGNOSIS — I2510 Atherosclerotic heart disease of native coronary artery without angina pectoris: Secondary | ICD-10-CM

## 2020-02-06 DIAGNOSIS — I361 Nonrheumatic tricuspid (valve) insufficiency: Secondary | ICD-10-CM

## 2020-02-06 DIAGNOSIS — I5043 Acute on chronic combined systolic (congestive) and diastolic (congestive) heart failure: Secondary | ICD-10-CM

## 2020-02-06 DIAGNOSIS — I447 Left bundle-branch block, unspecified: Secondary | ICD-10-CM

## 2020-02-07 DIAGNOSIS — I5043 Acute on chronic combined systolic (congestive) and diastolic (congestive) heart failure: Secondary | ICD-10-CM | POA: Diagnosis not present

## 2020-02-07 DIAGNOSIS — I447 Left bundle-branch block, unspecified: Secondary | ICD-10-CM | POA: Diagnosis not present

## 2020-02-07 DIAGNOSIS — I249 Acute ischemic heart disease, unspecified: Secondary | ICD-10-CM | POA: Diagnosis not present

## 2020-02-07 DIAGNOSIS — I2510 Atherosclerotic heart disease of native coronary artery without angina pectoris: Secondary | ICD-10-CM | POA: Diagnosis not present

## 2020-02-08 DIAGNOSIS — I5043 Acute on chronic combined systolic (congestive) and diastolic (congestive) heart failure: Secondary | ICD-10-CM | POA: Diagnosis not present

## 2020-02-08 DIAGNOSIS — I480 Paroxysmal atrial fibrillation: Secondary | ICD-10-CM | POA: Diagnosis not present

## 2020-02-08 DIAGNOSIS — J159 Unspecified bacterial pneumonia: Secondary | ICD-10-CM

## 2020-02-08 DIAGNOSIS — I249 Acute ischemic heart disease, unspecified: Secondary | ICD-10-CM | POA: Diagnosis not present

## 2020-02-09 DIAGNOSIS — J159 Unspecified bacterial pneumonia: Secondary | ICD-10-CM

## 2020-02-09 DIAGNOSIS — I2510 Atherosclerotic heart disease of native coronary artery without angina pectoris: Secondary | ICD-10-CM

## 2020-02-09 DIAGNOSIS — R778 Other specified abnormalities of plasma proteins: Secondary | ICD-10-CM | POA: Diagnosis not present

## 2020-02-09 DIAGNOSIS — I480 Paroxysmal atrial fibrillation: Secondary | ICD-10-CM

## 2020-02-10 DIAGNOSIS — I480 Paroxysmal atrial fibrillation: Secondary | ICD-10-CM | POA: Diagnosis not present

## 2020-02-10 DIAGNOSIS — J159 Unspecified bacterial pneumonia: Secondary | ICD-10-CM | POA: Diagnosis not present

## 2020-02-10 DIAGNOSIS — I2510 Atherosclerotic heart disease of native coronary artery without angina pectoris: Secondary | ICD-10-CM | POA: Diagnosis not present

## 2020-02-10 DIAGNOSIS — R778 Other specified abnormalities of plasma proteins: Secondary | ICD-10-CM | POA: Diagnosis not present

## 2020-02-14 ENCOUNTER — Ambulatory Visit: Payer: Medicare Other | Admitting: Physician Assistant

## 2020-02-15 DEATH — deceased

## 2020-03-24 ENCOUNTER — Other Ambulatory Visit: Payer: Self-pay | Admitting: Physician Assistant
